# Patient Record
Sex: Male | Born: 1960
Health system: Southern US, Community
[De-identification: ages and names within clinical notes are randomized; demographics above are authoritative.]

## PROBLEM LIST (undated history)

## (undated) DIAGNOSIS — M519 Unspecified thoracic, thoracolumbar and lumbosacral intervertebral disc disorder: Secondary | ICD-10-CM

## (undated) DIAGNOSIS — N200 Calculus of kidney: Secondary | ICD-10-CM

## (undated) HISTORY — PX: BACK SURGERY: SHX140

## (undated) HISTORY — PX: ANKLE SURGERY: SHX546

## (undated) HISTORY — PX: APPENDECTOMY: SHX54

## (undated) HISTORY — DX: Unspecified thoracic, thoracolumbar and lumbosacral intervertebral disc disorder: M51.9

## (undated) HISTORY — PX: OTHER SURGICAL HISTORY: SHX169

---

## 1999-12-07 DIAGNOSIS — C4491 Basal cell carcinoma of skin, unspecified: Secondary | ICD-10-CM

## 1999-12-07 HISTORY — DX: Basal cell carcinoma of skin, unspecified: C44.91

## 2000-01-02 ENCOUNTER — Encounter: Payer: Self-pay | Admitting: Emergency Medicine

## 2000-01-02 ENCOUNTER — Emergency Department (HOSPITAL_COMMUNITY): Admission: EM | Admit: 2000-01-02 | Discharge: 2000-01-02 | Payer: Self-pay | Admitting: Emergency Medicine

## 2002-01-28 ENCOUNTER — Encounter: Payer: Self-pay | Admitting: Specialist

## 2002-02-04 ENCOUNTER — Inpatient Hospital Stay (HOSPITAL_COMMUNITY): Admission: RE | Admit: 2002-02-04 | Discharge: 2002-02-05 | Payer: Self-pay | Admitting: Specialist

## 2004-03-16 ENCOUNTER — Emergency Department (HOSPITAL_COMMUNITY): Admission: EM | Admit: 2004-03-16 | Discharge: 2004-03-16 | Payer: Self-pay | Admitting: Emergency Medicine

## 2007-02-09 ENCOUNTER — Emergency Department (HOSPITAL_COMMUNITY): Admission: EM | Admit: 2007-02-09 | Discharge: 2007-02-09 | Payer: Self-pay | Admitting: Emergency Medicine

## 2009-01-19 ENCOUNTER — Encounter: Admission: RE | Admit: 2009-01-19 | Discharge: 2009-01-19 | Payer: Self-pay | Admitting: Family Medicine

## 2009-01-20 ENCOUNTER — Encounter: Admission: RE | Admit: 2009-01-20 | Discharge: 2009-01-20 | Payer: Self-pay | Admitting: Family Medicine

## 2009-09-18 ENCOUNTER — Encounter: Admission: RE | Admit: 2009-09-18 | Discharge: 2009-09-18 | Payer: Self-pay | Admitting: Gastroenterology

## 2010-07-26 ENCOUNTER — Other Ambulatory Visit: Payer: Self-pay | Admitting: Gastroenterology

## 2010-07-26 DIAGNOSIS — R1033 Periumbilical pain: Secondary | ICD-10-CM

## 2010-07-31 ENCOUNTER — Ambulatory Visit
Admission: RE | Admit: 2010-07-31 | Discharge: 2010-07-31 | Disposition: A | Discharge status: Home / Self Care | Payer: BC Managed Care – PPO | Source: Ambulatory Visit | Attending: Gastroenterology | Admitting: Gastroenterology

## 2010-07-31 DIAGNOSIS — R1033 Periumbilical pain: Secondary | ICD-10-CM

## 2010-07-31 MED ORDER — IOHEXOL 300 MG/ML  SOLN
100.0000 mL | Freq: Once | INTRAMUSCULAR | Status: AC | PRN
  Administered 2010-07-31: 100 mL via INTRAVENOUS

## 2010-10-19 NOTE — Op Note (Signed)
   TNAMELOMAX, POEHLER                           ACCOUNT NO.:  [id number]   MEDICAL RECORD NO.:  [id number]                   PATIENT TYPE:  INP   LOCATION:  X002                                 FACILITY:  [facility]   PHYSICIAN:  Philips J. Montez Morita, M.D.             DATE OF BIRTH:  01-11-1961   DATE OF PROCEDURE:  02/04/2002  DATE OF DISCHARGE:                                 OPERATIVE REPORT   PREOPERATIVE DIAGNOSIS:  Coccydynia.   POSTOPERATIVE DIAGNOSIS:  Coccydynia.   PROCEDURE:  Coccygectomy.   SURGEON:  Philips J. Montez Morita, M.D.   DESCRIPTION OF PROCEDURE:  After suitable general anesthesia, he was  positioned on the [place] frame and with my left finger coated with K-Y  jelly, it was inserted into his rectum after which he was prepped with  surgical scrub and Betadine and then draped with my left hand in the rectum.  An incision was made over the coccyx.  A survey was done with my right hand  assisted by Cherly Beach, P.A.  Primarily using the Bovie and blunt  dissection, the coccyx was freed up and removed and joint fluid from the  first and second coccygeal joint indicating a good bit of irritation at that  level.  Coated 2-0 Vicryl sutures to close the dead spaces and a small piece  of Gelfoam inserted although the wound was quite dry and some 0.5% plain  Marcaine about the area for some pain relief, 2-0 in the subcu, running  Monocryl with Steri-Strips on the skin.  He was brought to the recovery room  in good condition postoperatively.                                               Philips J. Montez Morita, M.D.    PJC/MEDQ  D:  02/04/2002  T:  02/04/2002  Job:  [id number]

## 2010-10-19 NOTE — H&P (Signed)
Albert Reyes, Albert Reyes                            ACCOUNT NO.:  [id number]   MEDICAL RECORD NO.:  [id number]                   PATIENT TYPE:  INP   LOCATION:  NA                                   FACILITY:  [facility]   PHYSICIAN:  Philips J. Montez Morita, M.D.             DATE OF BIRTH:  [birth year]   DATE OF ADMISSION:  02/04/2002  DATE OF DISCHARGE:                                HISTORY & PHYSICAL   CHIEF COMPLAINT:  Pain in my tailbone.   HISTORY OF PRESENT ILLNESS:  This 50 year old white male who has had  continuing pain in the coccygeal area for considerable amount of time.  He  has had no direct trauma to that but has had continuing problems concerning  this area.  Conservative treatment was initiated by Dr. Blossom Hoops, at [facility] in [place].  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  X-rays, lateral view, showed significant arthritis of the  sacrococcygeal junction.  Angulation is seen as well as arthritic changes.  Palpation of the sacrococcygeal area is markedly uncomfortable as well.  Anti-inflammatories have been tried with this patient but unfortunately, it  does not help.  Due to the mechanical nature of the existing problems, it is  felt he would benefit from surgical intervention.  He is going to undergo  coccygectomy during the hospitalization.   PAST MEDICAL HISTORY:  This is significant for renal calculi in 1985, 1993,  and the year 2000.  He also has history of trigeminal neuralgia three or  four years ago but has not bothered him since.   MEDICATIONS:  He takes no medications at the current time.   ALLERGIES:  No known drug allergies.   FAMILY PHYSICIAN:  Leanne Chang, M.D., of [facility],  [place].   PAST SURGICAL HISTORY:  Appendectomy in the distant past.  In 1981, he had a  lumbar laminectomy and renal calculi as mentioned above.   SOCIAL  HISTORY:  The patient is divorced.  He is a Air cabin crew.  He  smokes about a half a pack of cigarettes per day and plans to stop on  February 01, 2002.  He has one to two beers per day.  His mother will tend  for him after surgery.   FAMILY HISTORY:  Noncontributory.   REVIEW OF SYSTEMS:  CNS:  No seizures, paralysis, numbness or double vision.  RESPIRATORY:  No productive cough, no hemoptysis.  No shortness of breath.  CARDIOVASCULAR:  No chest pain, no angina, no orthopnea. GASTROINTESTINAL:  No nausea or vomiting, melena or bloody stool.  GENITOURINARY:  No  discharge, dysuria, or hematuria.  MUSCULOSKELETAL:  Primarily in present  illness, coccydynia.   PHYSICAL EXAMINATION:  GENERAL APPEARANCE:  Alert and cooperative, friendly  50 year old white male.  VITAL SIGNS:  Blood  pressure 120/80, pulse 72, respiratory rate 12.  HEENT:  Normocephalic.  He wears glasses.  PERRLA.  EOM intact.  Oropharynx  is clear.  CHEST:  Clear to auscultation, no rhonchi, no rales.  CARDIOVASCULAR:  Regular rate and rhythm, no murmurs are heard.  ABDOMEN:  Soft, nontender.  Liver and spleen not felt.  GENITAL/RECTAL:  Examination not done, not pertinent to present illness.  EXTREMITIES:  Normal.   ADMISSION DIAGNOSIS:  Severe coccydynia.   PLAN:  The patient will undergo a coccygectomy.       Dooley L. Shela Nevin, P.A.             Philips J. Montez Morita, M.D.    DLU/MEDQ  D:  01/28/2002  T:  01/28/2002  Job:  [id number]   cc:   Leanne Chang, M.D.

## 2011-03-15 LAB — BASIC METABOLIC PANEL
BUN: 30 — ABNORMAL HIGH
Calcium: 9.2
Creatinine, Ser: 1.1
GFR calc Af Amer: >60
GFR calc non Af Amer: >60

## 2011-03-15 LAB — CBC
Platelets: 280
RBC: 4.65
WBC: 11.5 — ABNORMAL HIGH

## 2011-03-15 LAB — DIFFERENTIAL
Eosinophils Absolute: 0.2
Lymphocytes Relative: 24
Lymphs Abs: 2.8
Monocytes Relative: 7
Neutro Abs: 7.7
Neutrophils Relative %: 67

## 2011-03-15 LAB — URINALYSIS, ROUTINE W REFLEX MICROSCOPIC
Glucose, UA: NEGATIVE
Leukocytes, UA: NEGATIVE
Protein, ur: 30 — AB
Specific Gravity, Urine: 1.024
pH: 6

## 2011-03-15 LAB — URINE MICROSCOPIC-ADD ON

## 2012-12-23 ENCOUNTER — Encounter: Payer: BC Managed Care – PPO | Admitting: Neurology

## 2014-11-07 ENCOUNTER — Other Ambulatory Visit: Payer: Self-pay | Admitting: Family Medicine

## 2014-11-07 DIAGNOSIS — R109 Unspecified abdominal pain: Secondary | ICD-10-CM

## 2014-11-09 ENCOUNTER — Encounter (HOSPITAL_COMMUNITY): Payer: Self-pay | Admitting: Emergency Medicine

## 2014-11-09 ENCOUNTER — Emergency Department (HOSPITAL_COMMUNITY)
Admission: EM | Admit: 2014-11-09 | Discharge: 2014-11-09 | Disposition: A | Discharge status: Home / Self Care | Payer: BLUE CROSS/BLUE SHIELD | Attending: Emergency Medicine | Admitting: Emergency Medicine

## 2014-11-09 DIAGNOSIS — Z87442 Personal history of urinary calculi: Secondary | ICD-10-CM | POA: Diagnosis not present

## 2014-11-09 DIAGNOSIS — Z203 Contact with and (suspected) exposure to rabies: Secondary | ICD-10-CM | POA: Insufficient documentation

## 2014-11-09 DIAGNOSIS — Z23 Encounter for immunization: Secondary | ICD-10-CM | POA: Diagnosis not present

## 2014-11-09 DIAGNOSIS — Z87891 Personal history of nicotine dependence: Secondary | ICD-10-CM | POA: Diagnosis not present

## 2014-11-09 DIAGNOSIS — Z209 Contact with and (suspected) exposure to unspecified communicable disease: Secondary | ICD-10-CM

## 2014-11-09 HISTORY — DX: Calculus of kidney: N20.0

## 2014-11-09 MED ORDER — RABIES VACCINE, PCEC IM SUSR
1.0000 mL | Freq: Once | INTRAMUSCULAR | Status: AC
  Administered 2014-11-09: 1 mL via INTRAMUSCULAR
  Filled 2014-11-09: qty 1

## 2014-11-09 MED ORDER — RABIES IMMUNE GLOBULIN 150 UNIT/ML IM INJ
1500.0000 [IU] | INJECTION | Freq: Once | INTRAMUSCULAR | Status: AC
  Administered 2014-11-09: 1500 [IU] via INTRAMUSCULAR
  Filled 2014-11-09: qty 10

## 2014-11-09 NOTE — ED Provider Notes (Signed)
CSN: [id number]     Arrival date & time 11/09/14  1219 History  This chart was scribed for non-physician practitioner, Montine Circle, PA-C working with Quintella Reichert, MD by Hansel Feinstein, ED scribe. This patient was seen in room WTR7/WTR7 and the patient's care was started at 12:34 PM    Chief Complaint  Patient presents with  . Rabies Injection   The history is provided by the patient. No language interpreter was used.    HPI Comments: Albert Reyes is a 54 y.o. male who presents to the Emergency Department with a chief complaint of a possible bite from a bat 4 days PTA. He is requesting a rabies vaccination because he is unsure how long the bat was in his house and states that he is uncertain if he had direct contact with the bat. He was advised to get the Rabies vaccination by his PCP office. He denies known contact with the bat or any known bite. He also denies numbness, and vomiting.  Past Medical History  Diagnosis Date  . Kidney stones    Past Surgical History  Procedure Laterality Date  . Appendectomy    . Back surgery    . Tailbone removed    . Ankle surgery     No family history on file. History  Substance Use Topics  . Smoking status: Former Research scientist (life sciences)  . Smokeless tobacco: Not on file  . Alcohol Use: Yes    Review of Systems  Gastrointestinal: Negative for vomiting.  Skin: Negative for wound.  Neurological: Negative for numbness.    Allergies  Review of patient's allergies indicates no known allergies.  Home Medications   Prior to Admission medications   Not on File   BP 136/83 mmHg  Pulse 73  Temp(Src) 98.4 F (36.9 C) (Oral)  Resp 17  Wt 170 lb (77.111 kg)  SpO2 98% Physical Exam  Constitutional: He is oriented to person, place, and time. He appears well-developed and well-nourished. No distress.  HENT:  Head: Normocephalic and atraumatic.  Mouth/Throat: Oropharynx is clear and moist.  Eyes: Conjunctivae and EOM are normal. Pupils are equal, round, and  reactive to light.  Neck: Normal range of motion. Neck supple. No tracheal deviation present.  Cardiovascular: Normal rate, regular rhythm and normal heart sounds.   Pulmonary/Chest: Effort normal and breath sounds normal. No respiratory distress.  Abdominal: Soft. He exhibits no distension. There is no tenderness. There is no rebound and no guarding.  Musculoskeletal: Normal range of motion. He exhibits no edema or tenderness.  Neurological: He is alert and oriented to person, place, and time.  Skin: Skin is warm and dry.  No obvious bite marks or scratch marks on skin  Psychiatric: He has a normal mood and affect. His behavior is normal.  Nursing note and vitals reviewed.  ED Course  Procedures (including critical care time) DIAGNOSTIC STUDIES: Oxygen Saturation is 98% on RA, normal by my interpretation.    COORDINATION OF CARE: 12:40 PM Discussed treatment plan with pt at bedside and pt agreed to plan.   Labs Review Labs Reviewed - No data to display  Imaging Review No results found.   EKG Interpretation None     MDM   Final diagnoses:  Exposure to bat without known bite  Rabies, need for prophylactic vaccination against    Patient with possible overnight exposure to a bat. He will need rabies vaccine and immunoglobulin. Day 0 is 6/8. Follow-up instructions given. Patient understands and agrees the plan. He  is stable and ready for discharge.  I personally performed the services described in this documentation, which was scribed in my presence. The recorded information has been reviewed and is accurate.    Montine Circle, PA-C 11/09/14 [place], MD 11/09/14 [phone]

## 2014-11-09 NOTE — Progress Notes (Signed)
Pt returned to [facility] ED and Cm was able to confirm with [facility] urgent care staff, Shonna Chock [phone]) that pt is able to go to [facility] urgent care on Saturday November 12 2014  As a first come, first serve pt. No appt is needed Cm shared this with the pt and he was appreciative and and voiced understanding

## 2014-11-09 NOTE — Progress Notes (Signed)
ED Cm consulted to assist with finding pt a way to obtain other rabies vaccinations Cm called and left message at [facility] for RN to return a call to Cm prior to 1300 Then realized office does not open until 1300  Presently Cm has not received a call from [place] to health department of [place] indicates they will only provided rabies pre exposure they generally sent pt to ED for post exposure, MD office does not have rabies vaccines Pending a return call from [facility] urgent care Spoke [place] [facility] (nada) who states they have rabies vaccines and can assist this pt The pt was informed of pending call from [facility] urgent care and that [facility] can assist him at an urgent care cost versus a ED cost Pt voiced understanding and appreciation confirmed his number as [phone]

## 2014-11-09 NOTE — ED Notes (Signed)
Pt states that Friday night he was laying on the living room couch watching tv when he saw something.  When he turned on the lights he saw it was a bat.  Pt has no contact with the bat but states that he was told that should get the injections.

## 2014-11-09 NOTE — Discharge Instructions (Signed)
You need to be seen as scheduled.  See above.  Rabies Vaccine What You Need to Know WHAT IS RABIES?  Rabies is a serious disease. It is caused by a virus.  Rabies is mainly a disease of animals. Humans get rabies when they are bitten by infected animals.  At first there might not be any symptoms. But weeks, or even years after a bite, rabies can cause pain, fatigue, headaches, fever, and irritability. These are followed by seizures, hallucinations, and paralysis. Human rabies is almost always fatal.  Wild animals, especially bats, are the most common source of human rabies infection in the [place]. Skunks, raccoons, dogs, cats, coyotes, foxes, and other mammals can also transmit the disease.  Human rabies is rare in the [place]. There have been only 63 cases diagnosed since 1990. However, between 16,000 and 39,000 people are vaccinated each year as a precaution after animal bites. Also, rabies is far more common in other parts of the world, with about 40,000 to 70,000 rabies-related deaths worldwide each year. Bites from unvaccinated dogs cause most of these cases. Rabies vaccine can prevent rabies. RABIES VACCINE  Rabies vaccine is given to people at high risk of rabies to protect them if they are exposed. It can also prevent the disease if it is given to a person after they have been exposed.  Rabies vaccine is made from killed rabies virus. It cannot cause rabies. WHO SHOULD GET RABIES VACCINE AND WHEN? Preventive Vaccination (No Exposure)  People at high risk of exposure to rabies, such as veterinarians, Insurance account manager, rabies laboratory workers, spelunkers, and rabies biologics production workers should be offered rabies vaccine.  The vaccine should also be considered for:  People whose activities bring them into frequent contact with rabies virus or with possibly rabid animals.  International travelers who are likely to come in contact with animals in parts of the  world where rabies is common.  The pre-exposure schedule for rabies vaccination is 3 doses, given at the following times:  Dose 1: As appropriate.  Dose 2: 7 days after Dose 1.  Dose 3: 21 days or 28 days after Dose 1.  For laboratory workers and others who may be repeatedly exposed to rabies virus, periodic testing for immunity is recommended and booster doses should be given as needed. (Testing or booster doses are not recommended for travelers). Ask your doctor for details. Vaccination After an Exposure Anyone who has been bitten by an animal, or who otherwise may have been exposed to rabies, should clean the wound and see a doctor immediately. The doctor will determine if they need to be vaccinated. A person who is exposed and has never been vaccinated against rabies should get 4 doses of rabies vaccine: one dose right away and additional doses on the 3rd, 7th, and 14th days. They should also get another shot called Rabies Immune Globulin at the same time as the first dose.  A person who has been previously vaccinated should get 2 doses of rabies vaccine: one right away and another on the 3rd day. Rabies Immune Globulin is not needed. TELL YOUR DOCTOR IF: Talk with a doctor before getting rabies vaccine if you:  Ever had a serious (life-threatening) allergic reaction to a previous dose of rabies vaccine or to any component of the vaccine; tell your doctor if you have any severe allergies.  Have a weakened immune system because of:  HIV, AIDS, or another disease that affects the immune system.  Treatment with  drugs that affect the immune system, such as steroids.  Cancer or cancer treatment with radiation or drugs. If you have a minor illness, such as a cold, you can be vaccinated. If you are moderately or severely ill, you should probably wait until you recover before getting a routine (non-exposure) dose of rabies vaccine. If you have been exposed to rabies virus, you should get the  vaccine regardless of any other illnesses you may have. WHAT ARE THE RISKS FROM RABIES VACCINE? A vaccine, like any medicine, is capable of causing serious problems, such as severe allergic reactions. The risk of a vaccine causing serious harm, or death, is extremely small. Serious problems from rabies vaccine are very rare.  Mild problems:  Soreness, redness, swelling, or itching where the shot was given (30% to 74%).  Headache, nausea, abdominal pain, muscle aches, or dizziness (5% to 40%). Moderate problems:  Hives, pain in the joints, or fever (about 6% of booster doses).  Other nervous system disorders, such as Guillain-Barr Syndrome (GBS), have been reported after rabies vaccine, but this happens so rarely that it is not known whether they are related to the vaccine. Note: Several brands of rabies vaccine are available in the [place], and reactions may vary between brands. Your provider can give you more information about a particular brand. WHAT IF THERE IS A SERIOUS REACTION? What should I look for? Look for anything that concerns you, such as signs of a severe allergic reaction, very high fever, or behavior changes.  Signs of a severe allergic reaction can include hives, swelling of the face and throat, difficulty breathing, a fast heartbeat, dizziness, and weakness. These would start a few minutes to a few hours after the vaccination. What should I do?  If you think it is a severe allergic reaction or other emergency that cannot wait, call 911 or get the person to the nearest hospital. Otherwise, call your doctor.  Afterward, the reaction should be reported to the Vaccine Adverse Event Reporting System (VAERS). Your doctor might file this report, or you can do it yourself through the VAERS website at www.vaers.SamedayNews.es or by calling 680-504-2188. VAERS is only for reporting reactions. They do not give medical advice. HOW CAN I LEARN MORE?  Ask your doctor.  Call your  local or state health department.  Contact the Centers for Disease Control and Prevention (CDC):  Visit the CDC rabies website at [place].no CDC Rabies Vaccine VIS (03/08/08) Document Released: 03/17/2006 Document Revised: 05/06/2012 Document Reviewed: 09/09/2012 [facility] Patient Information 2015 [place]. This information is not intended to replace advice given to you by your health care provider. Make sure you discuss any questions you have with your health care provider.

## 2014-11-10 ENCOUNTER — Ambulatory Visit
Admission: RE | Admit: 2014-11-10 | Discharge: 2014-11-10 | Disposition: A | Discharge status: Home / Self Care | Payer: BLUE CROSS/BLUE SHIELD | Source: Ambulatory Visit | Attending: Family Medicine | Admitting: Family Medicine

## 2014-11-10 DIAGNOSIS — R109 Unspecified abdominal pain: Secondary | ICD-10-CM

## 2014-11-10 MED ORDER — IOPAMIDOL (ISOVUE-300) INJECTION 61%
100.0000 mL | Freq: Once | INTRAVENOUS | Status: AC | PRN
  Administered 2014-11-10: 100 mL via INTRAVENOUS

## 2014-11-12 ENCOUNTER — Encounter (HOSPITAL_COMMUNITY): Payer: Self-pay | Admitting: Emergency Medicine

## 2014-11-12 ENCOUNTER — Emergency Department (INDEPENDENT_AMBULATORY_CARE_PROVIDER_SITE_OTHER)
Admission: EM | Admit: 2014-11-12 | Discharge: 2014-11-12 | Disposition: A | Discharge status: Home / Self Care | Payer: BLUE CROSS/BLUE SHIELD | Source: Home / Self Care

## 2014-11-12 DIAGNOSIS — Z203 Contact with and (suspected) exposure to rabies: Secondary | ICD-10-CM | POA: Diagnosis not present

## 2014-11-12 MED ORDER — RABIES VACCINE, PCEC IM SUSR
INTRAMUSCULAR | Status: AC
Start: 2014-11-12 — End: 2014-11-12
  Filled 2014-11-12: qty 1

## 2014-11-12 MED ORDER — RABIES VACCINE, PCEC IM SUSR
1.0000 mL | Freq: Once | INTRAMUSCULAR | Status: AC
  Administered 2014-11-12: 1 mL via INTRAMUSCULAR

## 2014-11-12 NOTE — ED Notes (Signed)
Here for day 3 injections

## 2014-11-16 ENCOUNTER — Encounter (HOSPITAL_COMMUNITY): Payer: Self-pay | Admitting: Emergency Medicine

## 2014-11-16 ENCOUNTER — Emergency Department (HOSPITAL_COMMUNITY)
Admission: EM | Admit: 2014-11-16 | Discharge: 2014-11-16 | Disposition: A | Discharge status: Home / Self Care | Payer: BLUE CROSS/BLUE SHIELD | Source: Home / Self Care

## 2014-11-16 DIAGNOSIS — Z203 Contact with and (suspected) exposure to rabies: Secondary | ICD-10-CM

## 2014-11-16 MED ORDER — RABIES VACCINE, PCEC IM SUSR
1.0000 mL | Freq: Once | INTRAMUSCULAR | Status: AC
  Administered 2014-11-16: 1 mL via INTRAMUSCULAR

## 2014-11-16 MED ORDER — RABIES VACCINE, PCEC IM SUSR
INTRAMUSCULAR | Status: AC
Start: 2014-11-16 — End: 2014-11-16
  Filled 2014-11-16: qty 1

## 2014-11-16 NOTE — ED Notes (Signed)
Here for 7th day rabies vaccination; #3  Voices no new concerns; alert, no signs of acute distress.

## 2014-11-16 NOTE — Discharge Instructions (Signed)
Return on 6/22 for day 14 rabies vaccination.

## 2014-11-23 ENCOUNTER — Emergency Department (HOSPITAL_COMMUNITY)
Admission: EM | Admit: 2014-11-23 | Discharge: 2014-11-23 | Disposition: A | Discharge status: Home / Self Care | Payer: BLUE CROSS/BLUE SHIELD | Source: Home / Self Care

## 2014-11-23 ENCOUNTER — Encounter (HOSPITAL_COMMUNITY): Payer: Self-pay | Admitting: Emergency Medicine

## 2014-11-23 MED ORDER — RABIES VACCINE, PCEC IM SUSR
1.0000 mL | Freq: Once | INTRAMUSCULAR | Status: AC
  Administered 2014-11-23: 1 mL via INTRAMUSCULAR

## 2014-11-23 MED ORDER — RABIES VACCINE, PCEC IM SUSR
INTRAMUSCULAR | Status: AC
  Filled 2014-11-23: qty 1

## 2014-11-23 NOTE — ED Provider Notes (Signed)
Patient presents to urgent care for his final rabies vaccine. Vaccine provided.  Gregor Hams, MD 11/23/14 1331

## 2014-11-23 NOTE — ED Notes (Signed)
Pt here for 14th day rabies vaccination.  Pt reports no problems.

## 2016-01-06 ENCOUNTER — Other Ambulatory Visit: Payer: Self-pay | Admitting: Orthopedic Surgery

## 2016-01-06 DIAGNOSIS — M7541 Impingement syndrome of right shoulder: Secondary | ICD-10-CM

## 2016-01-16 ENCOUNTER — Ambulatory Visit
Admission: RE | Admit: 2016-01-16 | Discharge: 2016-01-16 | Disposition: A | Discharge status: Home / Self Care | Payer: BLUE CROSS/BLUE SHIELD | Source: Ambulatory Visit | Attending: Orthopedic Surgery | Admitting: Orthopedic Surgery

## 2016-01-16 DIAGNOSIS — M7541 Impingement syndrome of right shoulder: Secondary | ICD-10-CM

## 2017-09-03 DIAGNOSIS — C4492 Squamous cell carcinoma of skin, unspecified: Secondary | ICD-10-CM

## 2017-09-03 HISTORY — DX: Squamous cell carcinoma of skin, unspecified: C44.92

## 2017-11-27 IMAGING — MR MR SHOULDER*R* W/O CM
4 of 5 series · 14 of 40 positions shown · non-contrast
Comparison: None.

CLINICAL DATA: Right shoulder pain for 2.5 months. Remote prior
shoulder dislocation.

EXAM:
MRI OF THE RIGHT SHOULDER WITHOUT CONTRAST
TECHNIQUE: Multiplanar, multisequence MR imaging of the shoulder was performed.
No intravenous contrast was administered.

[Series 6: T2 fat-sat · oblique · right · 3.0mm · 0.44mm/px · 3 of 21 slices shown (1 of 3)]
[im 4/21]
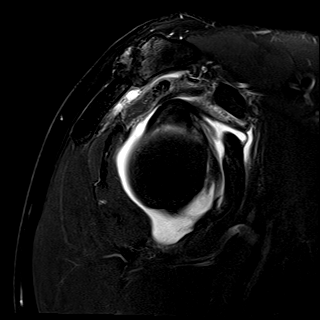
[im 11/21]
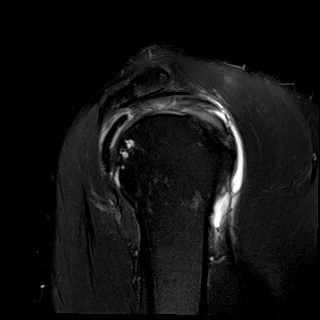
[im 17/21]
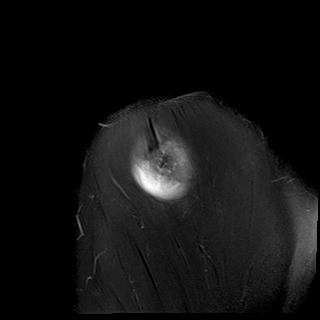

[Series 9: T2 fat-sat · axial · right · 3.0mm · 0.44mm/px · z∈[-28,+34]mm · 3 of 25 slices shown (2 of 3)]
[im 4/25]
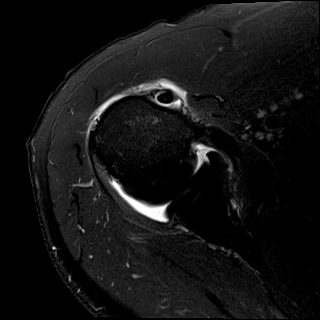
[im 13/25]
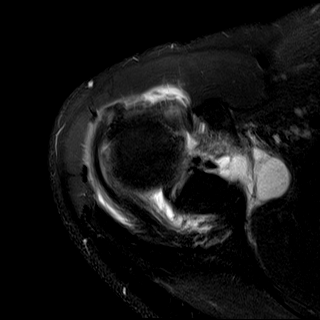
[im 22/25]
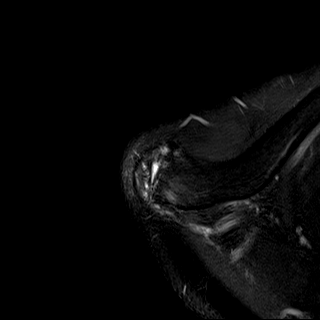

[Series 10: PD · oblique · right · 3.0mm · 0.18mm/px · 5 of 21 slices shown]
[im 1/21]
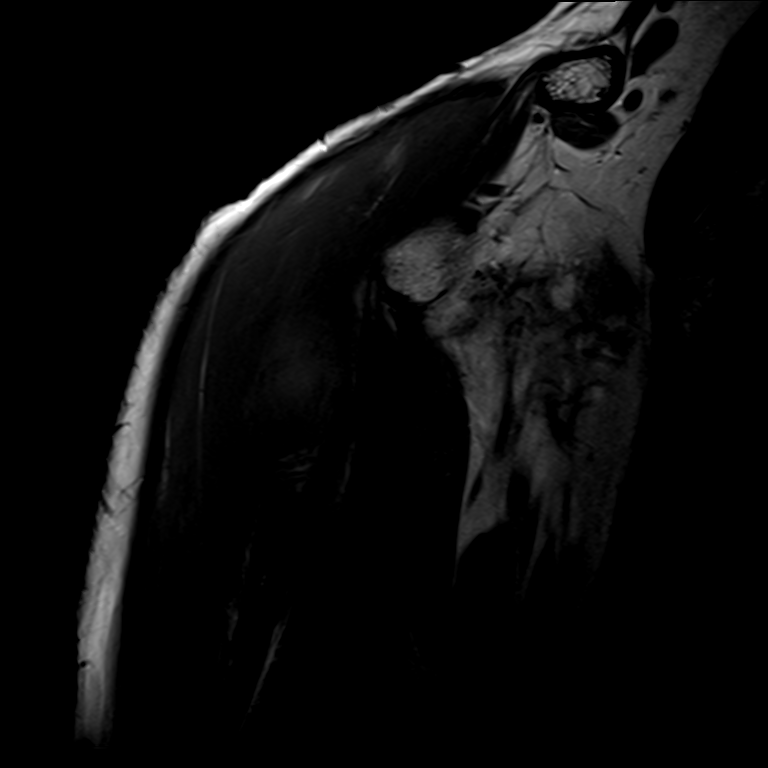
[im 3/21]
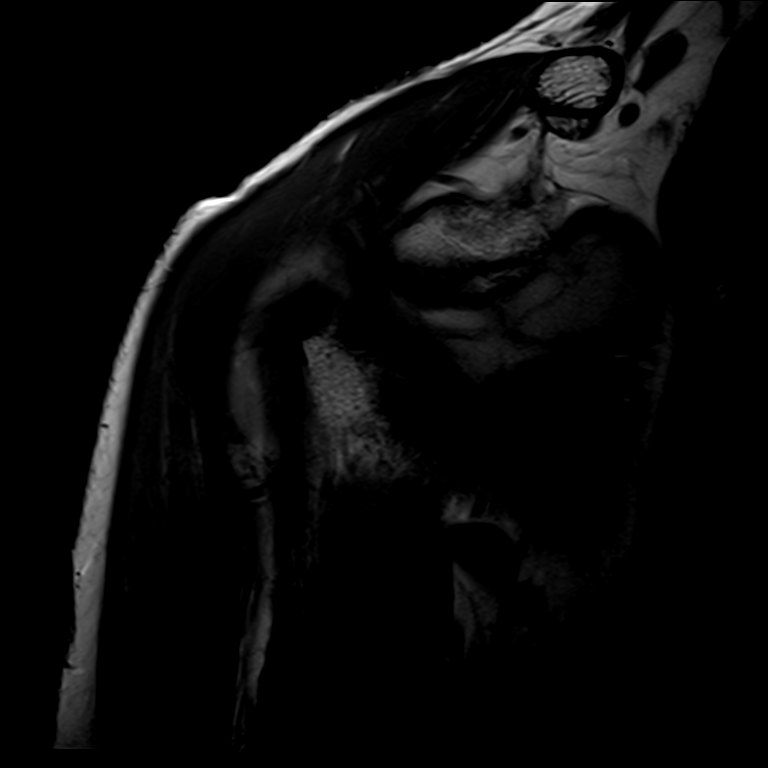
[im 6/21]
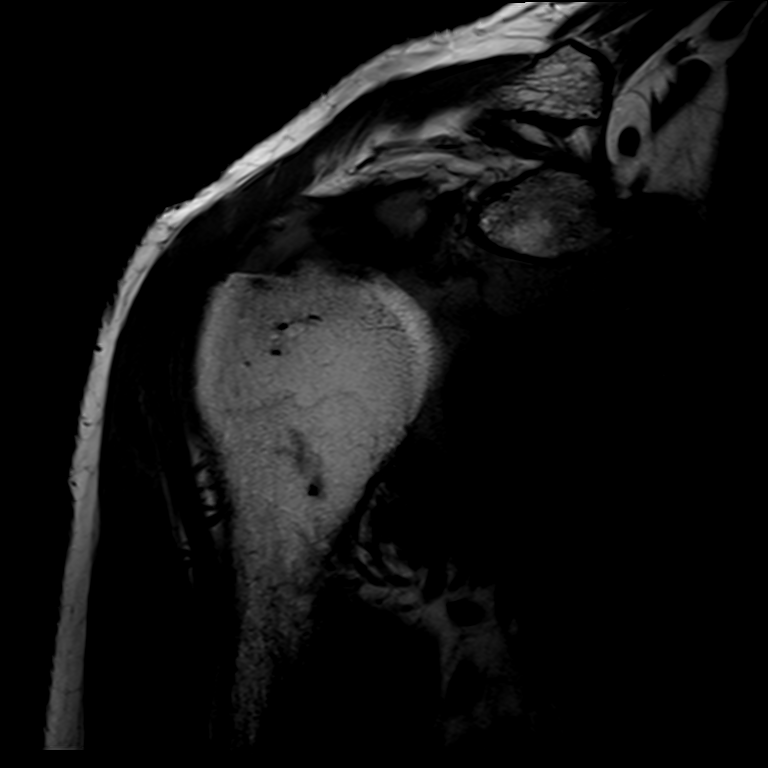
[im 12/21]
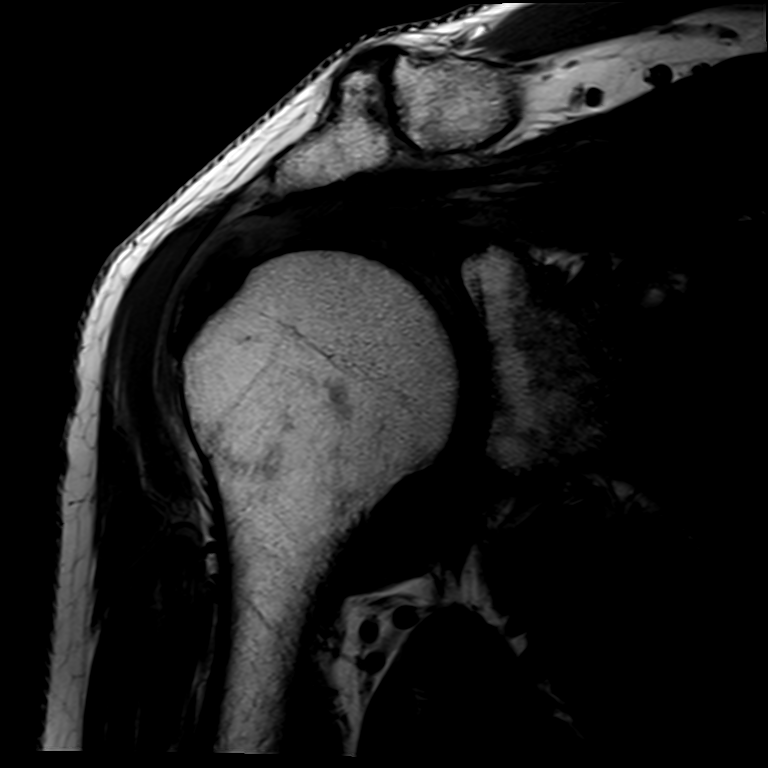
[im 18/21]
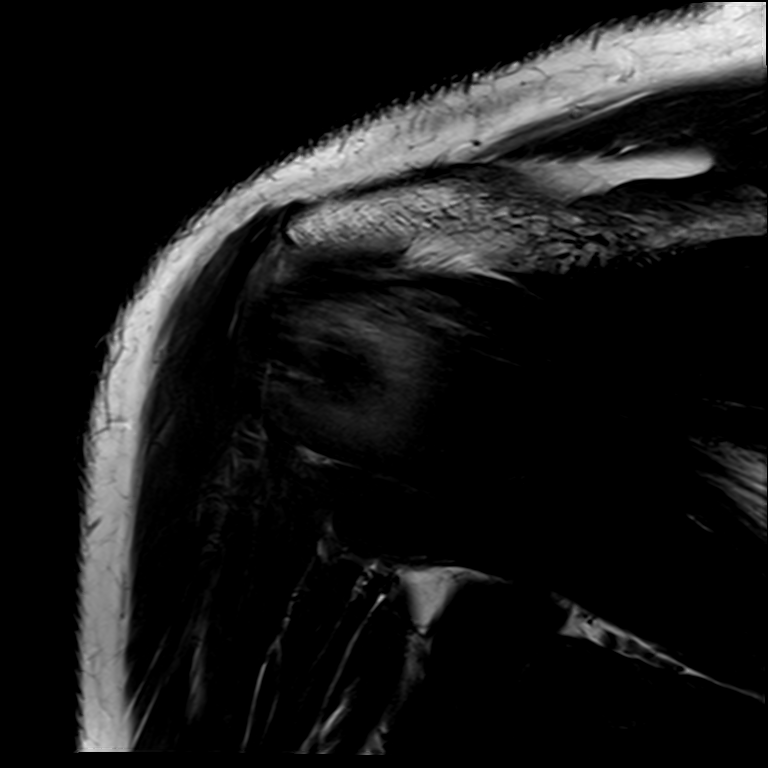

[Series 11: T2 fat-sat · oblique · right · 3.0mm · 0.22mm/px · 3 of 21 slices shown (3 of 3)]
[im 3/21]
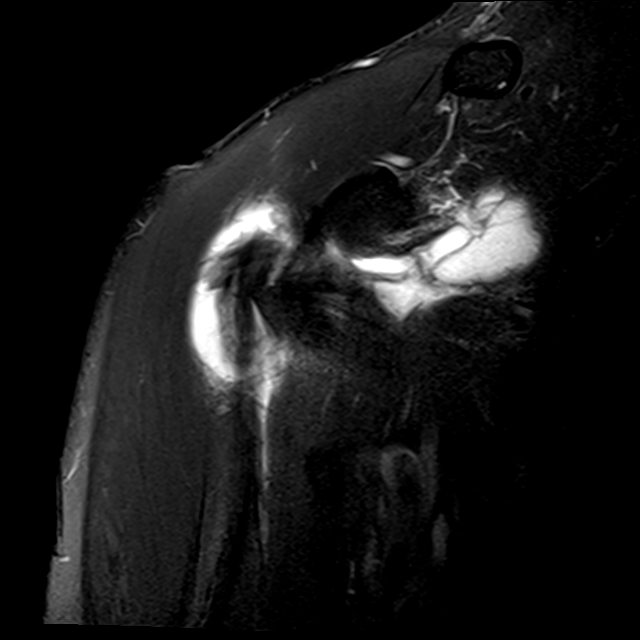
[im 12/21]
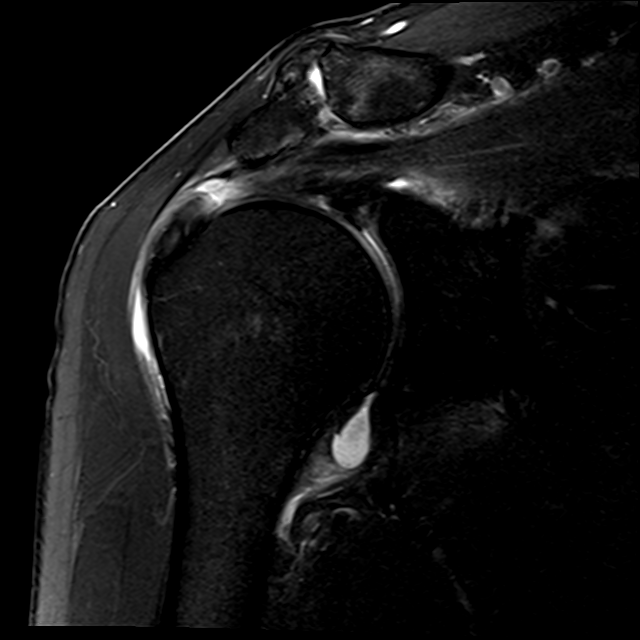
[im 18/21]
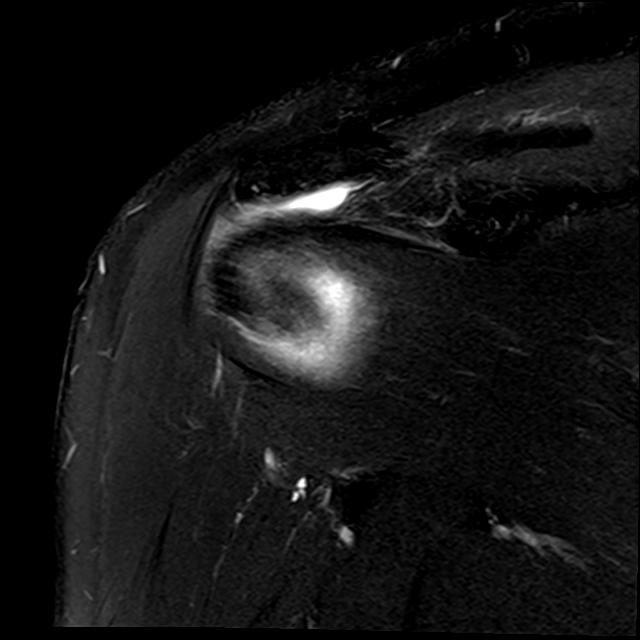

[14 of 40 positions shown; findings below may reference images not displayed]

FINDINGS: Rotator cuff: Full-thickness full width but non retracted
supraspinatus tear, with a 7 mm in length distal segment and with
about 5 mm gap between the proximal and distal tendon segments.
Partial thickness articular surface tearing of the subscapularis
tendon. Mild infraspinatus tendinopathy.

Muscles: Low-level edema tracks along the distal margins of the
supraspinatus muscle but without muscular atrophy or significant
internal edema signal.

Biceps long head: The intra-articular segment is greatly expanded
and indistinct compatible with high-grade partial tearing.

Acromioclavicular Joint: Moderate degenerative spurring and
subcortical marrow edema. Subacromial morphology is type 2 (curved).
As expected there is fluid in the subacromial subdeltoid bursa.

Glenohumeral Joint: Small joint effusion communicates with the
bursa. Articular cartilage intact.

Labrum: There is some linear increased signal in the posterior
superior labrum on image [DATE] not well shown on other sequences but
conceivably representing a nondisplaced superior labral tear.

Bones: No significant extra-articular osseous abnormalities
identified.

Other: No supplemental non-categorized findings.
IMPRESSION: 1. Full-thickness full width but not significantly retracted
supraspinatus tear, with a 7 mm in length distal segment and with a
5 mm gap between the proximal and distal tendon segments.
2. Partial thickness articular surface tearing of the subscapularis
; mild infraspinatus tendinopathy.
3. High-grade partial tearing of the intra-articular segment
long-head of the biceps.
4. Moderate AC joint arthropathy.
5. Faint increased linear signal in the posterior superior labrum on
1 sequence, reason the possibility of a small nondisplaced tear.

## 2021-06-12 ENCOUNTER — Encounter: Payer: Self-pay | Admitting: Dermatology

## 2021-06-12 ENCOUNTER — Ambulatory Visit: Payer: Managed Care, Other (non HMO) | Admitting: Dermatology

## 2021-06-12 ENCOUNTER — Other Ambulatory Visit: Payer: Self-pay

## 2021-06-12 DIAGNOSIS — Z1283 Encounter for screening for malignant neoplasm of skin: Secondary | ICD-10-CM

## 2021-06-12 DIAGNOSIS — L570 Actinic keratosis: Secondary | ICD-10-CM

## 2021-06-12 DIAGNOSIS — K1230 Oral mucositis (ulcerative), unspecified: Secondary | ICD-10-CM | POA: Diagnosis not present

## 2021-06-12 DIAGNOSIS — Q828 Other specified congenital malformations of skin: Secondary | ICD-10-CM | POA: Diagnosis not present

## 2021-06-12 NOTE — Patient Instructions (Signed)
Promoxine- anti itch cream can get Cereve.

## 2021-07-06 ENCOUNTER — Encounter: Payer: Self-pay | Admitting: Dermatology

## 2021-07-06 NOTE — Progress Notes (Signed)
° °  New Patient   Subjective  Albert Reyes is a 61 y.o. male who presents for the following: Annual Exam (Here for annual skin exam. Concerns lesion on left arm, lesion right arm, and right lower leg, and left foot. Patient wants his face checked. No bleeding./History of non mole skin cancers. ).  General skin examination, several spots of concern Location:  Duration:  Quality:  Associated Signs/Symptoms: Modifying Factors:  Severity:  Timing: Context:    The following portions of the chart were reviewed this encounter and updated as appropriate:  Tobacco   Allergies   Meds   Problems   Med Hx   Surg Hx   Fam Hx       Objective  Well appearing patient in no apparent distress; mood and affect are within normal limits. Mid Back General skin examination: No atypical pigmented lesions or nonmelanoma skin cancer.  Left Upper Arm - Posterior, Right Forearm - Posterior, Right Lower Leg - Anterior Multiple small gritty pink crusts which may be treated with field therapy in the future.  3 thicker lesions will be treated with liquid nitrogen today.    Left Dorsum of Foot 5 mm flattened pink crusts with sharply defined margin  Left Ala Nasi Two millimeter focal mucositis without definite lesion    A full examination was performed including scalp, head, eyes, ears, nose, lips, neck, chest, axillae, abdomen, back, buttocks, bilateral upper extremities, bilateral lower extremities, hands, feet, fingers, toes, fingernails, and toenails. All findings within normal limits unless otherwise noted below.   Assessment & Plan  Screening for malignant neoplasm of skin Mid Back  Annual skin examination, encouraged to self examine  twice annually.  Continue ultraviolet protection.  AK (actinic keratosis) (3) Left Upper Arm - Posterior; Right Forearm - Posterior; Right Lower Leg - Anterior  Face possible pdt or tolak tx on face Patient will do PDT   Destruction of lesion - Left Upper Arm -  Posterior, Right Forearm - Posterior, Right Lower Leg - Anterior Complexity: simple   Destruction method: cryotherapy   Informed consent: discussed and consent obtained   Timeout:  patient name, date of birth, surgical site, and procedure verified Lesion destroyed using liquid nitrogen: Yes   Cryotherapy cycles:  3 Outcome: patient tolerated procedure well with no complications   Post-procedure details: wound care instructions given    Porokeratosis Left Dorsum of Foot  Leave if stable  Mucositis Left Ala Nasi  Mupirocin or triple antibiotic ointment twice daily for 10 days; consult ENT specialist if spot does not clear.

## 2021-07-11 ENCOUNTER — Encounter: Payer: Self-pay | Admitting: Dermatology

## 2021-07-11 ENCOUNTER — Ambulatory Visit: Payer: Managed Care, Other (non HMO) | Admitting: Dermatology

## 2021-07-11 ENCOUNTER — Other Ambulatory Visit: Payer: Self-pay

## 2021-07-11 DIAGNOSIS — L570 Actinic keratosis: Secondary | ICD-10-CM

## 2021-07-11 MED ORDER — AMINOLEVULINIC ACID HCL 10 % EX GEL
2000.0000 mg | Freq: Once | CUTANEOUS | Status: AC
  Administered 2021-07-11: 2000 mg via TOPICAL

## 2021-07-11 NOTE — Patient Instructions (Signed)

## 2021-07-11 NOTE — Progress Notes (Signed)
Photodynamic Therapy Procedure Note Diagnosis: Actinic keratosis Location:  face Informed Consent: Discussed risks (burning, pain, redness, peeling, severe sunburn-like reaction, blistering, discoloration, lack of resolution) and benefits of the procedure, as well as the alternatives. Informed consent was obtained. Preparation: After cleansing the skin, the area to be treated was coated with Levulan.  This was allowed to sit on the skin for  90  minutes. Procedure Details: The patient was placed under the light source with appropriate eye protection for 16 minutes. After completing the treatment, the patient applied sunscreen to the treated areas. Patient tolerated the procedure well Plan: Avoid any sun exposure for the next 24 hours. Wear sunscreen daily for the next week. Observe normal sun precautions thereafter. Recommend OTC analgesia as needed for pain. Follow-up in  12  weeks.

## 2021-07-30 ENCOUNTER — Encounter: Payer: Self-pay | Admitting: Dermatology

## 2021-07-30 NOTE — Progress Notes (Signed)
° °  Follow-Up Visit   Subjective  Albert Reyes is a 61 y.o. male who presents for the following: Photodynamic Therapy (Pt here for PDT ).  Multiple actinic keratoses, here for PDT Location:  Duration:  Quality:  Associated Signs/Symptoms: Modifying Factors:  Severity:  Timing: Context:   Objective  Well appearing patient in no apparent distress; mood and affect are within normal limits. Head - Anterior (Face) Diffuse actinic keratoses particularly upper half of face, few hyperkeratotic.      A focused examination was performed including head and neck. Relevant physical exam findings are noted in the Assessment and Plan.   Assessment & Plan    AK (actinic keratosis) Head - Anterior (Face)  Photodynamic therapy - Head - Anterior (Face) Procedure discussed: discussed risks, benefits, side effects. and alternatives   Prep: site scrubbed/prepped with acetone   Location:  Face Type of treatment:  Blue light Aminolevulinic Acid (see MAR for details): Ameluz Amount of Ameluz (mg):  2000 Incubation time (minutes):  90 Number of minutes under lamp:  16 Number of seconds under lamp:  42 Cooling:  Fan and floor fan Outcome: patient tolerated procedure well with no complications   Post-procedure details: sunscreen applied and aftercare instructions given to patient    Related Medications Aminolevulinic Acid HCl 10 % GEL 2,000 mg       I, Lavonna Monarch, MD, have reviewed all documentation for this visit.  The documentation on 07/30/21 for the exam, diagnosis, procedures, and orders are all accurate and complete.

## 2021-09-18 ENCOUNTER — Ambulatory Visit: Payer: Managed Care, Other (non HMO) | Admitting: Dermatology

## 2021-09-18 ENCOUNTER — Encounter: Payer: Self-pay | Admitting: Dermatology

## 2021-09-18 DIAGNOSIS — D485 Neoplasm of uncertain behavior of skin: Secondary | ICD-10-CM | POA: Diagnosis not present

## 2021-09-18 DIAGNOSIS — L570 Actinic keratosis: Secondary | ICD-10-CM | POA: Diagnosis not present

## 2021-09-18 NOTE — Patient Instructions (Signed)

## 2021-09-24 ENCOUNTER — Telehealth: Payer: Self-pay | Admitting: *Deleted

## 2021-09-24 NOTE — Telephone Encounter (Signed)
-----   Message from Lavonna Monarch, MD sent at 09/21/2021  6:16 PM EDT ----- ?While I was pleased that the biopsy did not show skin cancer, it is not actually explaining the cause of this inflammatory lesion.  If it heals after biopsy there is no further treatment necessary.  If it is not healed and smooth in 4 weeks, I'd like to recheck. ?

## 2021-09-24 NOTE — Telephone Encounter (Signed)
Path to patient. Patient is going to call us in 4 weeks if lesion is not healed to make a follow up appointment. ?

## 2021-10-06 ENCOUNTER — Encounter: Payer: Self-pay | Admitting: Dermatology

## 2021-10-06 NOTE — Progress Notes (Signed)
? ?  Follow-Up Visit ?  ?Subjective  ?Albert Reyes is a 61 y.o. male who presents for the following: Follow-up (F/u for pdt. Pt thinks it did ok. Personal history of bcc and scc. ). ? ?Follow-up after photodynamic therapy, several spots to check ?Location:  ?Duration:  ?Quality:  ?Associated Signs/Symptoms: ?Modifying Factors:  ?Severity:  ?Timing: ?Context:  ? ?Objective  ?Well appearing patient in no apparent distress; mood and affect are within normal limits. ?Right Preauricular Area ?Eroded pearly 6 mm papule ? ? ? ? ?Chest - Medial [facility]), Left Malar Cheek, Right Buccal Cheek, Right Temple (2) ?Prep 60% improvement with PDT, few hypertrophic lesions will be treated with LN2 freeze ? ? ? ?All skin waist up examined. ? ? ?Assessment & Plan  ? ? ?Neoplasm of uncertain behavior of skin ?Right Preauricular Area ? ?Skin / nail biopsy ?Type of biopsy: tangential   ?Informed consent: discussed and consent obtained   ?Timeout: patient name, date of birth, surgical site, and procedure verified   ?Anesthesia: the lesion was anesthetized in a standard fashion   ?Anesthetic:  1% lidocaine w/ epinephrine 1-100,000 local infiltration ?Instrument used: flexible razor blade   ?Hemostasis achieved with: aluminum chloride and electrodesiccation   ?Outcome: patient tolerated procedure well   ?Post-procedure details: wound care instructions given   ? ?Specimen 1 - Surgical pathology ?Differential Diagnosis: bcc scc ? ?Check Margins: No ? ?AK (actinic keratosis) (5) ?Chest - Medial [facility]); Right Temple (2); Left Malar Cheek; Right Buccal Cheek ? ?Destruction of lesion - Chest - Medial [facility]), Left Malar Cheek, Right Buccal Cheek, Right Temple ?Complexity: simple   ?Destruction method: cryotherapy   ?Informed consent: discussed and consent obtained   ?Lesion destroyed using liquid nitrogen: Yes   ?Cryotherapy cycles:  3 ?Outcome: patient tolerated procedure well with no complications   ? ? ? ? ? ?I, Lavonna Monarch, MD, have  reviewed all documentation for this visit.  The documentation on 10/06/21 for the exam, diagnosis, procedures, and orders are all accurate and complete. ?

## 2022-06-25 ENCOUNTER — Encounter: Payer: Self-pay | Admitting: Diagnostic Neuroimaging

## 2022-06-25 ENCOUNTER — Ambulatory Visit: Payer: Commercial Managed Care - HMO | Admitting: Diagnostic Neuroimaging

## 2022-06-25 ENCOUNTER — Telehealth: Payer: Self-pay | Admitting: Diagnostic Neuroimaging

## 2022-06-25 VITALS — BP 116/75 | HR 71 | Ht 69.0 in | Wt 175.0 lb

## 2022-06-25 DIAGNOSIS — M48062 Spinal stenosis, lumbar region with neurogenic claudication: Secondary | ICD-10-CM

## 2022-06-25 NOTE — Telephone Encounter (Signed)
[employer] sent to [facility] they obtain auth [phone] 

## 2022-06-25 NOTE — Progress Notes (Signed)
[facility]  PATIENT: Albert Reyes DOB: [birth year]  REFERRING CLINICIAN: Lawerance Cruel, MD HISTORY FROM: patient  REASON FOR VISIT: new consult    HISTORICAL  CHIEF COMPLAINT:  Chief Complaint  Patient presents with   New Patient (Initial Visit)    Patient in room #6 and alone. Patient here today to discuss his numbness and weakness in both legs.    HISTORY OF PRESENT ILLNESS:   62 year old male here for evaluation of lower extremity numbness and pain.  History of low back pain rating to the left leg, status post surgery in 1981.  Symptoms greatly improved.  In the last 1 year has recurrence of low back pain rating to left leg, slightly in the right side, worse with standing and walking.  Symptoms symptoms are very painful.   REVIEW OF SYSTEMS: Full 14 system review of systems performed and negative with exception of: as per HPI.  ALLERGIES: No Known Allergies  HOME MEDICATIONS: Outpatient Medications Prior to Visit  Medication Sig Dispense Refill   cetirizine (ZYRTEC) 10 MG tablet Take 10 mg by mouth daily.     ibuprofen (ADVIL,MOTRIN) 200 MG tablet Take 600 mg by mouth every 6 (six) hours as needed for moderate pain.     meclizine (ANTIVERT) 25 MG tablet Take 25 mg by mouth 3 (three) times daily as needed for dizziness.     mupirocin ointment (BACTROBAN) 2 % 1 Application 2 (two) times daily.     naproxen sodium (ANAPROX) 220 MG tablet Take 220 mg by mouth 2 (two) times daily as needed (pain.).     Potassium 99 MG TABS Take by mouth.     No facility-administered medications prior to visit.    PAST MEDICAL HISTORY: Past Medical History:  Diagnosis Date   Basal cell carcinoma 08/08/2008   right upper bwck   Basal cell carcinoma 09/08/2000   below medial scalp   Basal cell carcinoma 12/07/1999   right shoulder   Kidney stones    Squamous cell carcinoma of skin 09/03/2017   center back    PAST SURGICAL HISTORY: Past Surgical  History:  Procedure Laterality Date   ANKLE SURGERY     APPENDECTOMY     BACK SURGERY     tailbone removed      FAMILY HISTORY: History reviewed. No pertinent family history.  SOCIAL HISTORY: Social History   Socioeconomic History   Marital status: Single    Spouse name: Not on file   Number of children: Not on file   Years of education: Not on file   Highest education level: Not on file  Occupational History   Not on file  Tobacco Use   Smoking status: Former   Smokeless tobacco: Not on file  Vaping Use   Vaping Use: Never used  Substance and Sexual Activity   Alcohol use: Yes   Drug use: Not on file   Sexual activity: Not on file  Other Topics Concern   Not on file  Social History Narrative   Not on file   Social Determinants of Health   Financial Resource Strain: Not on file  Food Insecurity: Not on file  Transportation Needs: Not on file  Physical Activity: Not on file  Stress: Not on file  Social Connections: Not on file  Intimate Partner Violence: Not on file     PHYSICAL EXAM  GENERAL EXAM/CONSTITUTIONAL: Vitals:  Vitals:   06/25/22 0936  BP: 116/75  Pulse: 71  Weight: 175 lb (79.4  kg)  Height: '5\' 9"'$  (1.753 m)   Body mass index is 25.84 kg/m. Wt Readings from Last 3 Encounters:  06/25/22 175 lb (79.4 kg)  11/09/14 170 lb (77.1 kg)   Patient is in no distress; well developed, nourished and groomed; neck is supple  CARDIOVASCULAR: Examination of carotid arteries is normal; no carotid bruits Regular rate and rhythm, no murmurs Examination of peripheral vascular system by observation and palpation is normal  EYES: Ophthalmoscopic exam of optic discs and posterior segments is normal; no papilledema or hemorrhages No results found.  MUSCULOSKELETAL: Gait, strength, tone, movements noted in Neurologic exam below  NEUROLOGIC: MENTAL STATUS:      No data to display         awake, alert, oriented to person, place and time recent and  remote memory intact normal attention and concentration language fluent, comprehension intact, naming intact fund of knowledge appropriate  CRANIAL NERVE:  2nd - no papilledema on fundoscopic exam 2nd, 3rd, 4th, 6th - pupils equal and reactive to light, visual fields full to confrontation, extraocular muscles intact, no nystagmus 5th - facial sensation symmetric 7th - facial strength symmetric 8th - hearing intact 9th - palate elevates symmetrically, uvula midline 11th - shoulder shrug symmetric 12th - tongue protrusion midline  MOTOR:  normal bulk and tone, full strength in the BUE, BLE  SENSORY:  normal and symmetric to light touch, temperature, vibration  COORDINATION:  finger-nose-finger, fine finger movements normal  REFLEXES:  deep tendon reflexes TRACE and symmetric  GAIT/STATION:  narrow based gait     DIAGNOSTIC DATA (LABS, IMAGING, TESTING) - I reviewed patient records, labs, notes, testing and imaging myself where available.  Lab Results  Component Value Date   WBC 11.5 (H) 02/09/2007   HGB 14.7 02/09/2007   HCT 42.7 02/09/2007   MCV 91.7 02/09/2007   PLT 280 02/09/2007      Component Value Date/Time   NA 137 02/09/2007 1121   K 4.1 02/09/2007 1121   CL 103 02/09/2007 1121   CO2 25 02/09/2007 1121   GLUCOSE 112 (H) 02/09/2007 1121   BUN 30 (H) 02/09/2007 1121   CREATININE 1.10 02/09/2007 1121   CALCIUM 9.2 02/09/2007 1121   GFRNONAA >60 02/09/2007 1121   GFRAA  02/09/2007 1121    >60        The eGFR has been calculated using the MDRD equation. This calculation has not been validated in all clinical   No results found for: "CHOL", "HDL", "LDLCALC", "LDLDIRECT", "TRIG", "CHOLHDL" No results found for: "HGBA1C" No results found for: "VITAMINB12" No results found for: "TSH"     ASSESSMENT AND PLAN  62 y.o. year old male here with:   Dx:  1. Spinal stenosis of lumbar region with neurogenic claudication     PLAN:  LUMBAR SPINAL  STENOSIS with neurogenic claudication (worsening over last 1 year; history of lumbar spine surgery 1981) - check MRI lumbar spine (due to severe pain, intermittent weakness) - refer to PT evaluation  Orders Placed This Encounter  Procedures   MR LUMBAR SPINE WO CONTRAST   Ambulatory referral to Physical Therapy   Return for pending if symptoms worsen or fail to improve, pending test results.    Penni Bombard, MD 6/76/1950, 93:26 AM Certified in Neurology, Neurophysiology and Neuroimaging  [facility] [address], [place], [place] [address] [phone]

## 2022-06-25 NOTE — Telephone Encounter (Signed)
Referral for Physical Therapy fax to [facility] ([place]). Phone: [phone], Fax: [phone].

## 2022-06-25 NOTE — Patient Instructions (Signed)
LUMBAR SPINAL STENOSIS with neurogenic claudication (worsening over last 1 year; history of lumbar spine surgery 1981) - check MRI lumbar spine - refer to PT evaluation

## 2022-07-04 NOTE — Telephone Encounter (Signed)
Orders Placed This Encounter  Procedures   DG Lumbar Spine 2-3 Views    Penni Bombard, MD 09/08/4037, 7:95 PM Certified in Neurology, Neurophysiology and Neuroimaging  [facility] [address] [place], [place] [address] [phone]

## 2022-07-04 NOTE — Addendum Note (Signed)
Addended by: Andrey Spearman R on: 07/04/2022 03:50 PM   Modules accepted: Orders

## 2022-07-04 NOTE — Telephone Encounter (Signed)
[employer] denied the MRI: Based on [employer] Spine Imaging Guidelines Section(s): Greater than Six Months Post-Operative (SP 15.1) and 1.0 General Guidelines, we cannot approve this request. Your healthcare provider told us that you have had a repair (surgery) to treat a problem with your spine. The request cannot be approved because:  You must have a plain x-ray following your spine surgery as the initial evaluation.   Imaging requires six weeks of provider directed treatment to be completed. Supported treatments include (but are not limited to) drugs for swelling or pain, an in office workout (physical therapy), and/or oral or injected steroids. This must have been completed in the past three months without improved symptoms. Contact (via office visit, phone, email, or messaging) must occur after the treatment is completed. This has not been met because: You have not completed six weeks of provider directed treatment. The provider directed treatment did not occur within the last three months. Symptoms must be the same or worse after treatment to support imaging. There was no contact with your provider after completing treatment.  Case #[id number] - [phone]

## 2022-07-08 ENCOUNTER — Other Ambulatory Visit: Payer: Managed Care, Other (non HMO)

## 2022-07-08 ENCOUNTER — Encounter: Payer: Self-pay | Admitting: Neurology

## 2022-07-08 NOTE — Telephone Encounter (Signed)
Sent the pt a mychart message advising the mri was denied and that xray was ordered and he can be seen as a walk in for xray at [facility].

## 2022-09-11 ENCOUNTER — Encounter: Payer: Self-pay | Admitting: Podiatry

## 2022-09-11 ENCOUNTER — Ambulatory Visit: Payer: Commercial Managed Care - HMO | Admitting: Podiatry

## 2022-09-11 DIAGNOSIS — G629 Polyneuropathy, unspecified: Secondary | ICD-10-CM | POA: Diagnosis not present

## 2022-09-11 NOTE — Progress Notes (Signed)
  Subjective:  Patient ID: Albert Reyes, male    DOB: May 19, 1961,   MRN: [id number]  Chief Complaint  Patient presents with   Foot Problem    Paresthesia of skin, bottom of feet, feels swollen and tight, mostly in the balls of feet and toes, mostly feels it nightly and gradually has become worse     62 y.o. male presents for concern as above. He relates its been going on for about a year. States he does have a history of back pain and has had back surgery relates he has pain in his legs in the past.  . Denies any other pedal complaints. Denies n/v/f/c.   Past Medical History:  Diagnosis Date   Basal cell carcinoma 08/08/2008   right upper bwck   Basal cell carcinoma 09/08/2000   below medial scalp   Basal cell carcinoma 12/07/1999   right shoulder   Kidney stones    Squamous cell carcinoma of skin 09/03/2017   center back    Objective:  Physical Exam: Vascular: DP/PT pulses 2/4 bilateral. CFT <3 seconds. Normal hair growth on digits. No edema.  Skin. No lacerations or abrasions bilateral feet.  Musculoskeletal: MMT 5/5 bilateral lower extremities in DF, PF, Inversion and Eversion. Deceased ROM in DF of ankle joint. No pain to palpation about the foot  Neurological: Sensation intact to light touch. Protective sensation intact. Negative tinels.   Assessment:   1. Peripheral polyneuropathy      Plan:  Patient was evaluated and treated and all questions answered. Discussed neuropathy and etiology as well as treatment with patient.  Radiographs reviewed and discussed with patient.  -Discussed and educated patient on diabetic foot care, especially with  regards to the vascular, neurological and musculoskeletal systems.  -Discussed supportive shoes at all times and checking feet regularly.  -Will try trial of taking multivitamin with B12 to make sure not deficiency related.  -Likely feel this is related to his back. Will call back in 3 months if still having trouble to start  gabapentin.  -Discussed trying capsaicin cream.  -Patient to return in 3 months for follow-up evaluation.    Louann Sjogren, DPM   2conc

## 2022-10-02 ENCOUNTER — Telehealth: Payer: Self-pay | Admitting: Diagnostic Neuroimaging

## 2022-10-02 NOTE — Telephone Encounter (Signed)
Pt stating it wasn't until this past weekend that he saw Baird Lyons, RN's my chart message.  He said the only notification he received was from his insurance company denying the MRI.  Pt states he was told about an xray.  Pt would like to know if that can still be scheduled, please call pt to discuss.

## 2022-10-03 NOTE — Telephone Encounter (Signed)
Called pt. Informed him of message nurse [facility] sent. Pt said thank you for giving me a call.

## 2022-10-11 ENCOUNTER — Encounter: Payer: Self-pay | Admitting: Urology

## 2022-10-11 ENCOUNTER — Other Ambulatory Visit: Payer: Self-pay | Admitting: Urology

## 2022-10-11 DIAGNOSIS — R972 Elevated prostate specific antigen [PSA]: Secondary | ICD-10-CM

## 2022-10-23 ENCOUNTER — Encounter: Payer: Self-pay | Admitting: Urology

## 2022-10-25 ENCOUNTER — Ambulatory Visit
Admission: RE | Admit: 2022-10-25 | Discharge: 2022-10-25 | Disposition: A | Discharge status: Home / Self Care | Payer: Commercial Managed Care - HMO | Source: Ambulatory Visit | Attending: Diagnostic Neuroimaging | Admitting: Diagnostic Neuroimaging

## 2022-10-25 DIAGNOSIS — M48062 Spinal stenosis, lumbar region with neurogenic claudication: Secondary | ICD-10-CM

## 2022-11-06 ENCOUNTER — Telehealth: Payer: Self-pay | Admitting: Diagnostic Neuroimaging

## 2022-11-06 NOTE — Telephone Encounter (Signed)
Pt states he does not understand the my chart results to his Lumbar x-ray.  Pt is asking for a call to discuss.

## 2022-11-06 NOTE — Telephone Encounter (Signed)
Please result when available  

## 2022-11-06 NOTE — Telephone Encounter (Signed)
I sent a message to [facility] asking them to resubmit the auth and include the xray results. They will call him to schedule when it is approved.

## 2022-11-14 NOTE — Telephone Encounter (Signed)
Referral for Physical Therapy fax to [facility] ([place]). Phone: [phone], Fax: [phone].  Was faxed 06/25/22. Can you request the records from them if he completed PT please? Thanks

## 2022-11-14 NOTE — Telephone Encounter (Signed)
I asked [facility] for an update and this is what they sent me from the insurance: They now requesting the following: Dates of prior provider directed conservative treatment over the most recent twelve weeks, noted as Physical Therapy per 06/25/2022 referral and date of re-evaluation by ordering provider for failure of treatment related to this MRI Lumbar Spine, (spinal canal and contents); without contrast material request.

## 2022-11-26 ENCOUNTER — Ambulatory Visit
Admission: RE | Admit: 2022-11-26 | Discharge: 2022-11-26 | Disposition: A | Discharge status: Home / Self Care | Payer: Commercial Managed Care - HMO | Source: Ambulatory Visit | Attending: Urology | Admitting: Urology

## 2022-11-26 DIAGNOSIS — R972 Elevated prostate specific antigen [PSA]: Secondary | ICD-10-CM

## 2022-11-26 MED ORDER — GADOPICLENOL 0.5 MMOL/ML IV SOLN
8.0000 mL | Freq: Once | INTRAVENOUS | Status: AC | PRN
  Administered 2022-11-26: 8 mL via INTRAVENOUS

## 2022-12-03 ENCOUNTER — Ambulatory Visit: Payer: Managed Care, Other (non HMO) | Admitting: Dermatology

## 2022-12-10 NOTE — Telephone Encounter (Signed)
Here is the info I got from Taron at [facility] who is handling the auth-she already sent the x-ray results: URGENT --- WE ARE UNABLE TO APPROVE THIS REQUEST WITHOUT ADDITIONAL INFORMATION. In order to process this case, please provide Dates of prior provider directed conservative treatment over the most recent twelve weeks, noted as Physical Therapy per 06/25/2022 referral and date of re-evaluation by ordering provider for failure of treatment related to this MRI Lumbar Spine, (spinal canal and contents); without contrast material request.  Please call [employer] at [phone], select Option #4, and enter in the Case ID [id number]  to speak with a clinician about this request.  To fax the requested information, please use this document as a coversheet and fax to [phone]. In order to be HIPAA compliant, when sending in faxed clinical information we need the following on EVERY page of the clinical documentation from the MEDICAL RECORD NUMBER Patients Full Name  Date of Birth  Thank you, [employer]

## 2022-12-11 ENCOUNTER — Telehealth: Payer: Self-pay | Admitting: *Deleted

## 2022-12-11 NOTE — Telephone Encounter (Signed)
Pt referral  was transfer to [facility] # [phone] pt plan of care will be faxed over today. Pt self check himself out of Pt.

## 2022-12-18 NOTE — Telephone Encounter (Signed)
I faxed the PT notes and sent Taron at [facility] a message to let her know so she can keep an eye on the authorization and I gave the notes back to Stanton Kidney to put in his chart.

## 2022-12-18 NOTE — Telephone Encounter (Signed)
The patient is asking if we have gotten his PT notes yet?

## 2022-12-19 NOTE — Telephone Encounter (Signed)
[employer] called me and said they need a peer to peer done for him. Call [phone] option 4 reference #[id number]

## 2022-12-23 NOTE — Telephone Encounter (Signed)
Placed the denial letter on Dr [facility] desk as reminder

## 2023-01-27 ENCOUNTER — Other Ambulatory Visit: Payer: Self-pay | Admitting: Urology

## 2023-01-28 ENCOUNTER — Encounter: Payer: Self-pay | Admitting: Diagnostic Neuroimaging

## 2023-01-28 NOTE — Telephone Encounter (Signed)
"  Per the pt mychart message below, he is reaching out to understand the hault in care and trying to move forward with a plan.   "For months I have been in extreme pain from time to time and unable to walk. Because of what appears to be conflicts between Dr Joycelyn Schmid and the insurance company I have not received any results to help with the pain.  I need to understand exactly what the issues are between Dr [employer] office and the insurance company before moving forward in the decision if I need to find a Dr who can help me with my pain.  I can be reached at [phone].  Please do not send messages via MyChart."    It appears that MRI was denied so xray was ordered then xray was denied. There was request for notes from PT which we received and it was submitted and they still denied. Can a appeal still be completed at this time?  Can we try to get auth again?

## 2023-01-29 NOTE — Telephone Encounter (Signed)
Pt is aware that we are working on re auth for imaging.

## 2023-02-04 NOTE — Telephone Encounter (Signed)
The insurance denied the MRI again. I will put the denial packet in the pod.

## 2023-02-06 ENCOUNTER — Telehealth (INDEPENDENT_AMBULATORY_CARE_PROVIDER_SITE_OTHER): Payer: Commercial Managed Care - HMO | Admitting: Diagnostic Neuroimaging

## 2023-02-06 ENCOUNTER — Encounter: Payer: Self-pay | Admitting: Diagnostic Neuroimaging

## 2023-02-06 DIAGNOSIS — M48062 Spinal stenosis, lumbar region with neurogenic claudication: Secondary | ICD-10-CM

## 2023-02-06 NOTE — Telephone Encounter (Signed)
Called the patient and offered VV that opened up for today.

## 2023-02-06 NOTE — Progress Notes (Signed)
[facility]  PATIENT: Albert Reyes DOB: 1961/02/24  REFERRING CLINICIAN: Daisy Floro, MD HISTORY FROM: patient  REASON FOR VISIT: follow up   HISTORICAL  CHIEF COMPLAINT:  Chief Complaint  Patient presents with   low back pain; leg pain    HISTORY OF PRESENT ILLNESS:   UPDATE (02/06/23, VRP): Since last visit, patient had lumbar spine x-ray demonstrating spondylosis and facet hypertrophy.  Also underwent physical therapy evaluation treatment and [facility] for more than 6 weeks.  He continues to do these exercises on his own at home without relief.  Continues to have severe low back pain radiating to the left leg, up to 9 out of 10 in severity.  Sometimes he has muscle weakness (3/5) where he is barely able to lift his leg after standing or walking for a long time.  Symptoms worse when he standing straight up.  Symptoms slightly relieved when he bends forward.  PRIOR HPI (06/25/22): 62 year old male here for evaluation of lower extremity numbness and pain.  History of low back pain rating to the left leg, status post surgery in 1981.  Symptoms greatly improved.  In the last 1 year has recurrence of low back pain rating to left leg, slightly in the right side, worse with standing and walking.  Symptoms symptoms are very painful.   REVIEW OF SYSTEMS: Full 14 system review of systems performed and negative with exception of: as per HPI.  ALLERGIES: No Known Allergies  HOME MEDICATIONS: Outpatient Medications Prior to Visit  Medication Sig Dispense Refill   cetirizine (ZYRTEC) 10 MG tablet Take 10 mg by mouth daily.     ibuprofen (ADVIL,MOTRIN) 200 MG tablet Take 600 mg by mouth every 6 (six) hours as needed for moderate pain.     naproxen sodium (ANAPROX) 220 MG tablet Take 220 mg by mouth 2 (two) times daily as needed (pain.).     No facility-administered medications prior to visit.    PAST MEDICAL HISTORY: Past Medical History:   Diagnosis Date   Basal cell carcinoma 08/08/2008   right upper bwck   Basal cell carcinoma 09/08/2000   below medial scalp   Basal cell carcinoma 12/07/1999   right shoulder   Kidney stones    Squamous cell carcinoma of skin 09/03/2017   center back    PAST SURGICAL HISTORY: Past Surgical History:  Procedure Laterality Date   ANKLE SURGERY     APPENDECTOMY     BACK SURGERY     tailbone removed      FAMILY HISTORY: No family history on file.  SOCIAL HISTORY: Social History   Socioeconomic History   Marital status: Single    Spouse name: Not on file   Number of children: Not on file   Years of education: Not on file   Highest education level: Not on file  Occupational History   Not on file  Tobacco Use   Smoking status: Former   Smokeless tobacco: Not on file  Vaping Use   Vaping status: Never Used  Substance and Sexual Activity   Alcohol use: Yes   Drug use: Not on file   Sexual activity: Not on file  Other Topics Concern   Not on file  Social History Narrative   Not on file   Social Determinants of Health   Financial Resource Strain: Not on file  Food Insecurity: Not on file  Transportation Needs: Not on file  Physical Activity: Not on file  Stress: Not  on file  Social Connections: Not on file  Intimate Partner Violence: Not on file     PHYSICAL EXAM  Video visit     DIAGNOSTIC DATA (LABS, IMAGING, TESTING) - I reviewed patient records, labs, notes, testing and imaging myself where available.  Lab Results  Component Value Date   WBC 11.5 (H) 02/09/2007   HGB 14.7 02/09/2007   HCT 42.7 02/09/2007   MCV 91.7 02/09/2007   PLT 280 02/09/2007      Component Value Date/Time   NA 137 02/09/2007 1121   K 4.1 02/09/2007 1121   CL 103 02/09/2007 1121   CO2 25 02/09/2007 1121   GLUCOSE 112 (H) 02/09/2007 1121   BUN 30 (H) 02/09/2007 1121   CREATININE 1.10 02/09/2007 1121   CALCIUM 9.2 02/09/2007 1121   GFRNONAA >60 02/09/2007 1121    GFRAA  02/09/2007 1121    >60        The eGFR has been calculated using the MDRD equation. This calculation has not been validated in all clinical   No results found for: "CHOL", "HDL", "LDLCALC", "LDLDIRECT", "TRIG", "CHOLHDL" No results found for: "HGBA1C" No results found for: "VITAMINB12" No results found for: "TSH"     ASSESSMENT AND PLAN  62 y.o. year old male here with:   Dx:  1. Spinal stenosis of lumbar region with neurogenic claudication      PLAN:  LUMBAR SPINAL STENOSIS with neurogenic claudication (worsening since 2023; history of lumbar spine surgery 1981) - worsening pain (up to 9/10) with weakness (3/5) with standing and walking; failed PT treatments x greater than 6 weeks at [facility]; pain with standing and walking; relieved with stooping forward - check MRI lumbar spine (due to severe pain, intermittent weakness; suspicious for spinal stenosis )  Orders Placed This Encounter  Procedures   MR Lumbar Spine W Wo Contrast   Return for pending test results, pending if symptoms worsen or fail to improve.   Virtual Visit via Video Note  I connected with Albert Reyes on 02/06/23 at  4:15 PM EDT by a video enabled telemedicine application and verified that I am speaking with the correct person using two identifiers.   I discussed the limitations of evaluation and management by telemedicine and the availability of in person appointments. The patient expressed understanding and agreed to proceed.  Patient is at home and I am at the office.   I spent 25 minutes of face-to-face and non-face-to-face time with patient.  This included previsit chart review, lab review, study review, order entry, electronic health record documentation, patient education.      Suanne Marker, MD 02/06/2023, 5:47 PM Certified in Neurology, Neurophysiology and Neuroimaging  [facility] [address], [address] [place], [place] [address] [phone]

## 2023-02-13 ENCOUNTER — Telehealth: Payer: Self-pay | Admitting: Diagnostic Neuroimaging

## 2023-02-13 NOTE — Telephone Encounter (Signed)
sent to [facility] they obtain [employer] auth [id number] 

## 2023-02-14 ENCOUNTER — Ambulatory Visit (HOSPITAL_BASED_OUTPATIENT_CLINIC_OR_DEPARTMENT_OTHER): Admission: RE | Admit: 2023-02-14 | Payer: Commercial Managed Care - HMO | Source: Home / Self Care | Admitting: Urology

## 2023-02-14 SURGERY — CYSTOURETEROSCOPY, WITH RETROGRADE PYELOGRAM AND STENT INSERTION
Anesthesia: General | Laterality: Right

## 2023-03-11 ENCOUNTER — Encounter: Payer: Self-pay | Admitting: Diagnostic Neuroimaging

## 2023-03-17 NOTE — Telephone Encounter (Signed)
Fax number [phone].

## 2023-03-17 NOTE — Telephone Encounter (Signed)
[employer] called and said that the notes they have from his last video visit do not have the dates of his PT, so unless we send revised notes with the dates he completed PT for 6 weeks In the last 3 months the MRI PA will be denied, or we can schedule a peer to peer. He is currently scheduled for the MRI at [facility] on 10/17 and they will cancel the appointment the day before if it is not authorized.  Stanton Kidney, do we have the PT notes that I could fax in?  For a peer to peer: [id number] and the reference # is [id number]

## 2023-03-19 ENCOUNTER — Encounter: Payer: Self-pay | Admitting: Diagnostic Neuroimaging

## 2023-03-19 NOTE — Telephone Encounter (Signed)
There are PT notes that are scanned under media around August 2024 but they are dated from February. However when the patient was originally ordered to get the MRI in January, they denied stating patient had to completed PT. Pt was then set up with PT and completed therapy which was not helpful. When we followed up at sept visit after assessment we attempted MRI once again since the PT has not been successful in treating the symptoms despite him continuing to do the PT exercises at home.  I will attempt to call insurance first and see if I can discuss this information.  Called the insurance phone number and was sent to Hinton Rao who was able to advise that the notes had everything listed except clarifying the dates of which therapy and medication were completed. Will update a addendum to the notes and submit sept visit notes, PT notes and Xray report for reconsideration

## 2023-03-19 NOTE — Progress Notes (Addendum)
Editing to add that pt originally was ordered to have a MRI in January and insurance denied stating that pt would need 6 wks of conservative therapy first. Pt was set up through [facility] to complete PT. His initial consultation was 07/24/2022 and he completed 1x a week PT along with home exercises. Pt was discharged from PT 08/30/2022 due to his pain was worsening and there was no improvement and also financial reasons. He was recommended to continue the at home exercises by the physical therapist. We submitted the notes to insurance again as reconsideration at which point insurance denied indicating pt needed to complete an Xray first. Xray was completed 10/25/2022 at which point indicated the need to move forward with MRI and since conservative treatment was failed and xray was completed per insurance request, we were asked to resubmit. Resubmitted to insurance and was denied stating needed updated office notes reflecting this has been completed. Pt comes for office visit today to reflect that he has completed trial of PT from 2/21-3/29/2024 with no improvement and worsening of pain and discomfort.  Pt has been doing the home exercises consistently since March 30,2024 to present day with no relief. Pt has for a year now been taking naproxen for pain over the counter as conservative therapy and the MRI is needed to proceed forward with treatment.

## 2023-03-19 NOTE — Telephone Encounter (Signed)
I have spoke with someone at insurance who basically states the notes just needed to be adjusted to reflect the dates. I have addended the note and included all the information. I have faxed the ov notes, xray report and PT notes that were scanned in media. I received confirmation and will provide to Tori, our MRI coordinator to follow up on.

## 2023-03-20 ENCOUNTER — Other Ambulatory Visit: Payer: Commercial Managed Care - HMO

## 2023-03-20 NOTE — Telephone Encounter (Signed)
Received the notification from [facility] and they have finally reviewed the information and approved coverage for the lumbar MRI spine. Ref Code: [id number] Auth : [id number]  Approval dates 03/11/2023-09/27/2023

## 2023-03-20 NOTE — Telephone Encounter (Signed)
I also let [facility] know he is approved and to call him to get rescheduled.

## 2023-03-24 ENCOUNTER — Ambulatory Visit
Admission: RE | Admit: 2023-03-24 | Discharge: 2023-03-24 | Disposition: A | Discharge status: Home / Self Care | Payer: Commercial Managed Care - HMO | Source: Ambulatory Visit | Attending: Diagnostic Neuroimaging | Admitting: Diagnostic Neuroimaging

## 2023-03-24 DIAGNOSIS — M48062 Spinal stenosis, lumbar region with neurogenic claudication: Secondary | ICD-10-CM

## 2023-03-24 MED ORDER — GADOPICLENOL 0.5 MMOL/ML IV SOLN
8.0000 mL | Freq: Once | INTRAVENOUS | Status: AC | PRN
  Administered 2023-03-24: 8 mL via INTRAVENOUS

## 2023-03-25 ENCOUNTER — Other Ambulatory Visit: Payer: Self-pay | Admitting: Diagnostic Neuroimaging

## 2023-03-25 DIAGNOSIS — M48062 Spinal stenosis, lumbar region with neurogenic claudication: Secondary | ICD-10-CM

## 2023-04-24 ENCOUNTER — Telehealth: Payer: Self-pay | Admitting: Diagnostic Neuroimaging

## 2023-04-24 NOTE — Telephone Encounter (Signed)
Referral for neurosurgery fax to [facility]. Phone: [phone], Fax: [phone] 

## 2024-02-23 NOTE — Communication Body (Signed)
 Communications Note ___________________________________________________________ Patient Name Albert Reyes Date of Birth 1961-03-18 Date of Call 01/29/2024 Home phone [phone] Cell phone [phone] Day phone Alternate phone ___________________________________________________________ [name] with: Time of call: 10:42 AM Call taken by: [name] Contact type: Call type: other Telephone Contact Detail Date  Time  Employee  Detail   01/29/2024  10:43 AM  [name]  Left Message [employer] patient, transfer to [phone]   01/30/2024  3:36 PM  [name]  Patient would like some exercise books mailed to him for his back. Which ones should i send?   02/03/2024  4:08 PM  [name]  You can send him the Fit Back pamphlet.   02/04/2024  11:20 AM  [name]  Mailed to patients home.    Provider: Louis MD, [facility] 02/04/2024 11:20 AM    Document generated by: [name]

## 2024-02-29 NOTE — Progress Notes (Signed)
 Cardiology Office Note   Date:  03/01/2024  ID:  DREZDEN SEITZINGER, DOB 09-30-1960, MRN [id number] PCP: Okey Carlin Redbird, MD  [facility] Providers Cardiologist:  None     History of Present Illness Albert Reyes is a 63 y.o. male past medical history of tobacco use, hyperlipidemia, family history of CAD who was referred by his PCP due to fatigue.  Patient states that he had back surgery in May of this year and since that time he has felt very fatigued after doing yard work.  Prior to getting back surgery his activity was limited due to back and bilateral lower extremity pain and since having the surgery his activity is limited due to his fatigue.  He does occasionally get left-sided neck discomfort as well.  He does not get chest pain with this.  He has a family history of CAD with PCI in mom in her 94s.  He has a 1 pack/day tobacco user and is trying to quit however his best friend recently passed away 1 month ago and this has been making it difficult for him to quit smoking.  He is very concerned about his cardiovascular health since his friend passed away suddenly.  He does get some shortness of breath with exertion with some wheezing however he attributes this to smoking.   ROS:  Review of Systems  All other systems reviewed and are negative.   Physical Exam  Physical Exam Vitals and nursing note reviewed.  Constitutional:      Appearance: Normal appearance.  HENT:     Head: Normocephalic and atraumatic.  Eyes:     Conjunctiva/sclera: Conjunctivae normal.  Neck:     Vascular: No carotid bruit.  Cardiovascular:     Rate and Rhythm: Normal rate and regular rhythm.  Pulmonary:     Effort: Pulmonary effort is normal.     Breath sounds: Normal breath sounds.  Musculoskeletal:        General: No swelling or tenderness.  Skin:    Coloration: Skin is not jaundiced or pale.  Neurological:     Mental Status: He is alert.     VS:  BP 128/76   Pulse 66   Ht 5' 9 (1.753  m)   Wt 169 lb 9.6 oz (76.9 kg)   SpO2 99%   BMI 25.05 kg/m         Wt Readings from Last 3 Encounters:  03/01/24 169 lb 9.6 oz (76.9 kg)  06/25/22 175 lb (79.4 kg)  11/09/14 170 lb (77.1 kg)     EKG Interpretation Date/Time:  Monday March 01 2024 08:13:58 EDT Ventricular Rate:  66 PR Interval:  172 QRS Duration:  84 QT Interval:  396 QTC Calculation: 415 R Axis:   -7  Text Interpretation: Normal sinus rhythm Normal ECG When compared with ECG of 16-Mar-2004 16:19, Questionable change in QRS axis Confirmed by Kriste Hicks [phone]) on 03/01/2024 8:41:52 AM    Studies Reviewed        Risk Assessment/Calculations             ASCVD risk score: The ASCVD Risk score (Arnett DK, et al., 2019) failed to calculate for the following reasons:   Cannot find a previous HDL lab   Cannot find a previous total cholesterol lab   ASSESSMENT  Exertional fatigue Dyspnea on exertion Tobacco abuse Hyperlipidemia Family history of CAD  Plan  Will check an echocardiogram Will check a coronary CTA for risk stratification given his exertional symptoms  and CAD risk factor Cardiac risk counseling and prevention recommendations: Heart healthy/Mediterranean diet with whole grains, fruits, vegetable, fish, lean meats, nuts, and olive oil. Limit salt. Moderate walking, 3-5 times/week for 30-50 minutes each session. Aim for at least 150 minutes.week. Goal should be pace of 3 miles/hour, or walking 1.5 miles in 30 minutes Avoidance of tobacco products. Avoid excess alcohol.  Follow up: 1 year pending workup above          Signed, Emeline FORBES Calender, MD

## 2024-03-01 ENCOUNTER — Ambulatory Visit: Attending: Internal Medicine | Admitting: Internal Medicine

## 2024-03-01 ENCOUNTER — Encounter: Payer: Self-pay | Admitting: Internal Medicine

## 2024-03-01 VITALS — BP 128/76 | HR 66 | Ht 69.0 in | Wt 169.6 lb

## 2024-03-01 DIAGNOSIS — Z716 Tobacco abuse counseling: Secondary | ICD-10-CM | POA: Diagnosis not present

## 2024-03-01 DIAGNOSIS — Z79899 Other long term (current) drug therapy: Secondary | ICD-10-CM

## 2024-03-01 DIAGNOSIS — R5383 Other fatigue: Secondary | ICD-10-CM

## 2024-03-01 DIAGNOSIS — Z720 Tobacco use: Secondary | ICD-10-CM | POA: Diagnosis not present

## 2024-03-01 DIAGNOSIS — R0609 Other forms of dyspnea: Secondary | ICD-10-CM | POA: Diagnosis not present

## 2024-03-01 DIAGNOSIS — Z8249 Family history of ischemic heart disease and other diseases of the circulatory system: Secondary | ICD-10-CM | POA: Insufficient documentation

## 2024-03-01 MED ORDER — METOPROLOL TARTRATE 50 MG PO TABS: 50.0000 mg | ORAL_TABLET | Freq: Once | ORAL | 0 refills | Status: AC

## 2024-03-01 NOTE — Patient Instructions (Signed)
 Medication Instructions:  Your physician recommends that you continue on your current medications as directed. Please refer to the Current Medication list given to you today.  *If you need a refill on your cardiac medications before your next appointment, please call your pharmacy*  Lab Work: Please complete a FASTING lipid panel and a CMET in our first floor lab before you leave today.   If you have labs (blood work) drawn today and your tests are completely normal, you will receive your results only by: MyChart Message (if you have MyChart) OR A paper copy in the mail If you have any lab test that is abnormal or we need to change your treatment, we will call you to review the results.  Testing/Procedures: Your physician has requested that you have an echocardiogram. Echocardiography is a painless test that uses sound waves to create images of your heart. It provides your doctor with information about the size and shape of your heart and how well your heart's chambers and valves are working. This procedure takes approximately one hour. There are no restrictions for this procedure. Please do NOT wear cologne, perfume, aftershave, or lotions (deodorant is allowed). Please arrive 15 minutes prior to your appointment time.  Please note: We ask at that you not bring children with you during ultrasound (echo/ vascular) testing. Due to room size and safety concerns, children are not allowed in the ultrasound rooms during exams. Our front office staff cannot provide observation of children in our lobby area while testing is being conducted. An adult accompanying a patient to their appointment will only be allowed in the ultrasound room at the discretion of the ultrasound technician under special circumstances. We apologize for any inconvenience.    Your cardiac CT will be scheduled at:    [facility] [address]  [place], [place] [address] [phone]  If  scheduled at the Heart and Vascular Tower at [facility] street, please enter the parking lot using the [address] entrance and use the FREE valet service at the patient drop-off area. Enter the building and check-in with registration on the main floor.   Please follow these instructions carefully (unless otherwise directed):  An IV will be required for this test and Nitroglycerin will be given.  Hold all erectile dysfunction medications at least 3 days (72 hrs) prior to test. (Ie viagra, cialis, sildenafil, tadalafil, etc)   On the Night Before the Test: Be sure to Drink plenty of water. Do not consume any caffeinated/decaffeinated beverages or chocolate 12 hours prior to your test. Do not take any antihistamines 12 hours prior to your test.   On the Day of the Test: Drink plenty of water until 1 hour prior to the test. Do not eat any food 1 hour prior to test. You may take your regular medications prior to the test.  Take metoprolol (Lopressor) two hours prior to test. If you take Furosemide/Hydrochlorothiazide/Spironolactone/Chlorthalidone, please HOLD on the morning of the test.       After the Test: Drink plenty of water. After receiving IV contrast, you may experience a mild flushed feeling. This is normal. On occasion, you may experience a mild rash up to 24 hours after the test. This is not dangerous. If this occurs, you can take Benadryl 25 mg, Zyrtec, Claritin, or Allegra and increase your fluid intake. (Patients taking Tikosyn should avoid Benadryl, and may take Zyrtec, Claritin, or Allegra) If you experience trouble breathing, this can be serious. If it  is severe call 911 IMMEDIATELY. If it is mild, please call our office.  We will call to schedule your test 2-4 weeks out understanding that some insurance companies will need an authorization prior to the service being performed.   For more information and frequently asked questions, please visit our website :  [email or web address]  For non-scheduling related questions, please contact the cardiac imaging nurse navigator should you have any questions/concerns: Cardiac Imaging Nurse Navigators Direct Office Dial: [phone]   For scheduling needs, including cancellations and rescheduling, please call [place], [phone].   Follow-Up: At [facility], you and your health needs are our priority.  As part of our continuing mission to provide you with exceptional heart care, our providers are all part of one team.  This team includes your primary Cardiologist (physician) and Advanced Practice Providers or APPs (Physician Assistants and Nurse Practitioners) who all work together to provide you with the care you need, when you need it.  Your next appointment:   1 year(s)  Provider:   Dr. Emeline Calender, MD

## 2024-03-02 ENCOUNTER — Ambulatory Visit: Payer: Self-pay | Admitting: Internal Medicine

## 2024-03-02 LAB — COMPREHENSIVE METABOLIC PANEL WITH GFR
ALT: 23 IU/L (ref 0–44)
AST: 18 IU/L (ref 0–40)
Albumin: 4.2 g/dL (ref 3.9–4.9)
Alkaline Phosphatase: 95 IU/L (ref 47–123)
BUN/Creatinine Ratio: 18 (ref 10–24)
BUN: 20 mg/dL (ref 8–27)
Bilirubin Total: 0.2 mg/dL (ref 0.0–1.2)
CO2: 23 mmol/L (ref 20–29)
Calcium: 10.1 mg/dL (ref 8.6–10.2)
Chloride: 102 mmol/L (ref 96–106)
Creatinine, Ser: 1.1 mg/dL (ref 0.76–1.27)
Globulin, Total: 2.3 g/dL (ref 1.5–4.5)
Glucose: 90 mg/dL (ref 70–99)
Potassium: 4.7 mmol/L (ref 3.5–5.2)
Sodium: 140 mmol/L (ref 134–144)
Total Protein: 6.5 g/dL (ref 6.0–8.5)
eGFR: 75 mL/min/1.73 (ref >59)

## 2024-03-02 LAB — LIPID PANEL
Chol/HDL Ratio: 2.5 ratio (ref 0.0–5.0)
Cholesterol, Total: 172 mg/dL (ref 100–199)
HDL: 69 mg/dL (ref >39)
LDL Chol Calc (NIH): 83 mg/dL (ref 0–99)
Triglycerides: 114 mg/dL (ref 0–149)
VLDL Cholesterol Cal: 20 mg/dL (ref 5–40)

## 2024-03-08 ENCOUNTER — Encounter (HOSPITAL_COMMUNITY): Payer: Self-pay

## 2024-03-09 ENCOUNTER — Ambulatory Visit (HOSPITAL_COMMUNITY)
Admission: RE | Admit: 2024-03-09 | Discharge: 2024-03-09 | Disposition: A | Discharge status: Home / Self Care | Source: Ambulatory Visit | Attending: Internal Medicine | Admitting: Internal Medicine

## 2024-03-09 DIAGNOSIS — R0609 Other forms of dyspnea: Secondary | ICD-10-CM | POA: Insufficient documentation

## 2024-03-09 DIAGNOSIS — R5383 Other fatigue: Secondary | ICD-10-CM | POA: Diagnosis not present

## 2024-03-09 MED ORDER — NITROGLYCERIN 0.4 MG SL SUBL
0.8000 mg | SUBLINGUAL_TABLET | Freq: Once | SUBLINGUAL | Status: AC
  Administered 2024-03-09: 0.8 mg via SUBLINGUAL

## 2024-03-09 MED ORDER — IOHEXOL 350 MG/ML SOLN
100.0000 mL | Freq: Once | INTRAVENOUS | Status: AC | PRN
  Administered 2024-03-09: 100 mL via INTRAVENOUS

## 2024-04-08 ENCOUNTER — Ambulatory Visit (HOSPITAL_COMMUNITY)
Admission: RE | Admit: 2024-04-08 | Discharge: 2024-04-08 | Disposition: A | Discharge status: Home / Self Care | Source: Ambulatory Visit | Attending: Cardiovascular Disease | Admitting: Cardiovascular Disease

## 2024-04-08 DIAGNOSIS — R5383 Other fatigue: Secondary | ICD-10-CM | POA: Diagnosis present

## 2024-04-08 DIAGNOSIS — I349 Nonrheumatic mitral valve disorder, unspecified: Secondary | ICD-10-CM | POA: Diagnosis not present

## 2024-04-08 DIAGNOSIS — Z8249 Family history of ischemic heart disease and other diseases of the circulatory system: Secondary | ICD-10-CM | POA: Insufficient documentation

## 2024-04-08 DIAGNOSIS — R0600 Dyspnea, unspecified: Secondary | ICD-10-CM | POA: Insufficient documentation

## 2024-04-08 DIAGNOSIS — Z87891 Personal history of nicotine dependence: Secondary | ICD-10-CM | POA: Insufficient documentation

## 2024-04-08 DIAGNOSIS — I340 Nonrheumatic mitral (valve) insufficiency: Secondary | ICD-10-CM | POA: Insufficient documentation

## 2024-04-08 LAB — ECHOCARDIOGRAM COMPLETE
Area-P 1/2: 3.03 cm2
S' Lateral: 3.1 cm
